# Patient Record
Sex: Male | Born: 1975 | Race: White | Hispanic: No | Marital: Married | Smoking: Never smoker
Health system: Southern US, Community
[De-identification: ages and names within clinical notes are randomized; demographics above are authoritative.]

## PROBLEM LIST (undated history)

## (undated) DIAGNOSIS — E119 Type 2 diabetes mellitus without complications: Secondary | ICD-10-CM

## (undated) HISTORY — DX: Type 2 diabetes mellitus without complications: E11.9

## (undated) HISTORY — PX: VASECTOMY: SHX75

## (undated) HISTORY — PX: MASTOIDECTOMY: SHX711

---

## 2007-12-25 ENCOUNTER — Ambulatory Visit: Payer: Self-pay | Admitting: Family Medicine

## 2007-12-25 DIAGNOSIS — E669 Obesity, unspecified: Secondary | ICD-10-CM | POA: Insufficient documentation

## 2008-01-04 ENCOUNTER — Ambulatory Visit: Payer: Self-pay | Admitting: Family Medicine

## 2008-01-05 LAB — CONVERTED CEMR LAB
LDL Cholesterol: 89 mg/dL (ref 0–99)
Total CHOL/HDL Ratio: 4.6
Triglycerides: 90 mg/dL (ref 0–149)

## 2013-12-25 ENCOUNTER — Ambulatory Visit (INDEPENDENT_AMBULATORY_CARE_PROVIDER_SITE_OTHER): Payer: No Typology Code available for payment source | Admitting: Family Medicine

## 2013-12-25 VITALS — BP 110/82 | HR 103 | Temp 99.0°F | Resp 16 | Ht 67.5 in | Wt 256.5 lb

## 2013-12-25 DIAGNOSIS — H9201 Otalgia, right ear: Secondary | ICD-10-CM

## 2013-12-25 DIAGNOSIS — H6091 Unspecified otitis externa, right ear: Secondary | ICD-10-CM

## 2013-12-25 DIAGNOSIS — H9209 Otalgia, unspecified ear: Secondary | ICD-10-CM

## 2013-12-25 DIAGNOSIS — H60399 Other infective otitis externa, unspecified ear: Secondary | ICD-10-CM

## 2013-12-25 MED ORDER — OFLOXACIN 0.3 % OT SOLN: 5.0000 [drp] | Freq: Two times a day (BID) | OTIC | Status: DC

## 2013-12-25 MED ORDER — AMOXICILLIN-POT CLAVULANATE 875-125 MG PO TABS: 1.0000 | ORAL_TABLET | Freq: Two times a day (BID) | ORAL | Status: DC

## 2013-12-25 MED ORDER — HYDROCODONE-ACETAMINOPHEN 5-325 MG PO TABS: 1.0000 | ORAL_TABLET | ORAL | Status: DC | PRN

## 2013-12-25 NOTE — Patient Instructions (Signed)
Augmentin 875 one twice daily. May cause slightly loose stools.  Use the ear drops, Floxin, 5 drops in the ear twice daily as directed  Take the hydrocodone pain pills one every 4 hours if needed for pain. It can cause a little sedation, so best used at nighttime.  In the daytime take ibuprofen 800 mg maximum 3 times in 24 hours (4x200 mg)  Return as needed

## 2013-12-25 NOTE — Progress Notes (Signed)
Subjective: 38 year old man who has a history of periodically getting ear infections. He had a mastoidectomy many years ago on the right side. He had a little cold a week or so ago. He was at the beach 2 weeks ago. He's been having ear pain for 4 days in the right ear. He may have had a little fever yesterday.  Objective: Left TM has a lot of scarring but no active inflammation. Right ear has some erythema and puffiness outside of the canal on the ear lobe, and the canal has significant swelling with a passageway of about 2 mm left. A tiny spot of the drum can be seen. Could not tell if there is any middle ear fluid or infection.  Assessment: Right otitis externa and otalgia  Plan: Floxin otic drops twice daily Augmentin 875 twice daily Norco when necessary pain Return if further problems. If he gets worse he would probably need a wick inserted.

## 2014-08-03 ENCOUNTER — Encounter (HOSPITAL_COMMUNITY): Payer: Self-pay | Admitting: Emergency Medicine

## 2014-08-03 ENCOUNTER — Emergency Department (HOSPITAL_COMMUNITY)
Admission: EM | Admit: 2014-08-03 | Discharge: 2014-08-04 | Disposition: A | Discharge status: Home / Self Care | Payer: Commercial Managed Care - PPO | Attending: Emergency Medicine | Admitting: Emergency Medicine

## 2014-08-03 ENCOUNTER — Emergency Department (HOSPITAL_COMMUNITY): Payer: Commercial Managed Care - PPO

## 2014-08-03 DIAGNOSIS — R002 Palpitations: Secondary | ICD-10-CM | POA: Insufficient documentation

## 2014-08-03 DIAGNOSIS — R079 Chest pain, unspecified: Secondary | ICD-10-CM | POA: Diagnosis present

## 2014-08-03 LAB — BASIC METABOLIC PANEL
Anion gap: 10 (ref 5–15)
BUN: 17 mg/dL (ref 6–23)
CO2: 24 mmol/L (ref 19–32)
Calcium: 9.3 mg/dL (ref 8.4–10.5)
Chloride: 103 mmol/L (ref 96–112)
Creatinine, Ser: 0.96 mg/dL (ref 0.50–1.35)
GFR calc Af Amer: >90 mL/min (ref >90)
GLUCOSE: 187 mg/dL — AB (ref 70–99)
POTASSIUM: 4.2 mmol/L (ref 3.5–5.1)
Sodium: 137 mmol/L (ref 135–145)

## 2014-08-03 LAB — BRAIN NATRIURETIC PEPTIDE: B NATRIURETIC PEPTIDE 5: 6.6 pg/mL (ref 0.0–100.0)

## 2014-08-03 LAB — I-STAT TROPONIN, ED: TROPONIN I, POC: 0 ng/mL (ref 0.00–0.08)

## 2014-08-03 LAB — CBC
HEMATOCRIT: 42.6 % (ref 39.0–52.0)
Hemoglobin: 14.5 g/dL (ref 13.0–17.0)
MCH: 28.3 pg (ref 26.0–34.0)
MCHC: 34 g/dL (ref 30.0–36.0)
MCV: 83 fL (ref 78.0–100.0)
PLATELETS: 240 10*3/uL (ref 150–400)
RBC: 5.13 MIL/uL (ref 4.22–5.81)
RDW: 14.3 % (ref 11.5–15.5)
WBC: 11.8 10*3/uL — AB (ref 4.0–10.5)

## 2014-08-03 NOTE — ED Provider Notes (Signed)
CSN: [id number]     Arrival date & time 08/03/14  1854 History   First MD Initiated Contact with Patient 08/03/14 2300     Chief Complaint  Patient presents with  . Chest Pain     (Consider location/radiation/quality/duration/timing/severity/associated sxs/prior Treatment) HPI   39 year old male presents for evaluation of chest pain. Patient reports the past 3 days he has had intermittent heart palpitation which he described as a fluttering heart and occasional chest tightness. Symptoms waxing and waning but today worsen. Earlier today he developed and/or follows with a migraine headache lasting for a few hours but has since resolved. Did report mild nausea but that has resolved as well. His chest discomfort is now 2 of 10. He denies having any fever, chills, lightheadedness, dizziness, pleuritic chest pain, productive cough, hemoptysis, abdominal pain, vomiting or diarrhea. Denies any recent medication changes. Does report family history of cardiac disease including atrial fibrillation in both grandfather and father which concerns him. He denies having prior history of PE/ DVT, no recent surgery, prolonged bed rest, unilateral leg swelling or calf pain. He is a nonsmoker. Denies taking any stimulants such as caffeine, although the congestion. He did report having poison ivy several weeks ago and was taken and homeopathic medication for 2 weeks. He does not know the name of the medication.      History reviewed. No pertinent past medical history. Past Surgical History  Procedure Laterality Date  . Mastoidectomy     No family history on file. History  Substance Use Topics  . Smoking status: Never Smoker   . Smokeless tobacco: Never Used  . Alcohol Use: No    Review of Systems  All other systems reviewed and are negative.     Allergies  Review of patient's allergies indicates no known allergies.  Home Medications   Prior to Admission medications   Medication Sig Start Date  End Date Taking? Authorizing Provider  amoxicillin-clavulanate (AUGMENTIN) 875-125 MG per tablet Take 1 tablet by mouth 2 (two) times daily. Patient not taking: Reported on 08/03/2014 12/25/13   Peyton Najjar, MD  HYDROcodone-acetaminophen [facility]) 5-325 MG per tablet Take 1 tablet by mouth every 4 (four) hours as needed. Patient not taking: Reported on 08/03/2014 12/25/13   Peyton Najjar, MD  ofloxacin (FLOXIN) 0.3 % otic solution Place 5 drops into the right ear 2 (two) times daily. Patient not taking: Reported on 08/03/2014 12/25/13   Peyton Najjar, MD   BP 126/78 mmHg  Pulse 66  Temp(Src) 97.9 F (36.6 C)  Resp 18  Ht  (1.727 m)  Wt 265 lb (120.203 kg)  BMI 40.30 kg/m2  SpO2 96% Physical Exam  Constitutional: He is oriented to person, place, and time. He appears well-developed and well-nourished. No distress.  HENT:  Head: Atraumatic.  Eyes: Conjunctivae are normal.  Neck: Normal range of motion. Neck supple. No JVD present.  Cardiovascular: Normal rate, regular rhythm and intact distal pulses.   Pulmonary/Chest: Effort normal and breath sounds normal. No respiratory distress. He exhibits no tenderness.  Abdominal: Soft. Bowel sounds are normal. There is no tenderness.  Musculoskeletal: He exhibits no edema.  Neurological: He is alert and oriented to person, place, and time.  Skin: No rash noted.  Psychiatric: He has a normal mood and affect.  Nursing note and vitals reviewed.   ED Course  Procedures (including critical care time)  Patient here with complaint of heart palpitation and chest discomfort. Palpitation has resolved. EKG shows normal sinus  rhythm without any arrhythmia or acute ischemic changes. Symptoms atypical for ACS. Heart score of 1, low risk for MACE.  Pt is PERC negative.  Pt stable for discharge with outpt f/u .   Labs Review Labs Reviewed  CBC - Abnormal; Notable for the following:    WBC 11.8 (*)    All other components within normal limits  BASIC  METABOLIC PANEL - Abnormal; Notable for the following:    Glucose, Bld 187 (*)    All other components within normal limits  BRAIN NATRIURETIC PEPTIDE  I-STAT TROPOININ, ED    Imaging Review Dg Chest 2 View  08/03/2014   CLINICAL DATA:  [age] year old male with chest pressure, nausea and palpitations  EXAM: CHEST  2 VIEW  COMPARISON:  None.  FINDINGS: Cardiopericardial silhouette is top-normal in size. No evidence of pulmonary edema, focal airspace consolidation, pleural effusion or pneumothorax. No acute osseous abnormality. Unremarkable visualized upper abdominal bowel gas pattern.  IMPRESSION: 1. No active cardiopulmonary process. 2. Heart size is at the upper limits of normal.   Electronically Signed   By: Malachy MoanHeath  McCullough M.D.   On: 08/03/2014 19:58     EKG Interpretation None      Date: 08/03/2014  Rate:76  Rhythm: normal sinus rhythm  QRS Axis: normal  Intervals: normal  ST/T Wave abnormalities: normal  Conduction Disutrbances: none  Narrative Interpretation:   Old EKG Reviewed: No significant changes noted     MDM   Final diagnoses:  Heart palpitations    BP 126/78 mmHg  Pulse 66  Temp(Src) 97.9 F (36.6 C)  Resp 18  Ht 5\' 8"  (1.727 m)  Wt 265 lb (120.203 kg)  BMI 40.30 kg/m2  SpO2 96%  I have reviewed nursing notes and vital signs. I personally reviewed the imaging tests through PACS system  I reviewed available ER/hospitalization records thought the EMR     Fayrene HelperBowie Keelynn Furgerson, PA-C 08/04/14 0010  Benjiman CoreNathan Pickering, MD 08/06/14 [phone]

## 2014-08-03 NOTE — ED Notes (Signed)
C/o fluttering feeling in chest and tightness across chest that started last night while watching tv.  States pain has been consistent today with sob and nausea.  Denies vomiting.  Also reports headache.

## 2014-08-04 NOTE — Discharge Instructions (Signed)
Please follow up with a primary care provider for further evaluation of your heart palpitation.  Return if your condition worsen or if you have other concerns.  Use resources below to find a primary care provider.   Palpitations A palpitation is the feeling that your heartbeat is irregular or is faster than normal. It may feel like your heart is fluttering or skipping a beat. Palpitations are usually not a serious problem. However, in some cases, you may need further medical evaluation. CAUSES  Palpitations can be caused by:  Smoking.  Caffeine or other stimulants, such as diet pills or energy drinks.  Alcohol.  Stress and anxiety.  Strenuous physical activity.  Fatigue.  Certain medicines.  Heart disease, especially if you have a history of irregular heart rhythms (arrhythmias), such as atrial fibrillation, atrial flutter, or supraventricular tachycardia.  An improperly working pacemaker or defibrillator. DIAGNOSIS  To find the cause of your palpitations, your health care provider will take your medical history and perform a physical exam. Your health care provider may also have you take a test called an ambulatory electrocardiogram (ECG). An ECG records your heartbeat patterns over a 24-hour period. You may also have other tests, such as:  Transthoracic echocardiogram (TTE). During echocardiography, sound waves are used to evaluate how blood flows through your heart.  Transesophageal echocardiogram (TEE).  Cardiac monitoring. This allows your health care provider to monitor your heart rate and rhythm in real time.  Holter monitor. This is a portable device that records your heartbeat and can help diagnose heart arrhythmias. It allows your health care provider to track your heart activity for several days, if needed.  Stress tests by exercise or by giving medicine that makes the heart beat faster. TREATMENT  Treatment of palpitations depends on the cause of your symptoms and can  vary greatly. Most cases of palpitations do not require any treatment other than time, relaxation, and monitoring your symptoms. Other causes, such as atrial fibrillation, atrial flutter, or supraventricular tachycardia, usually require further treatment. HOME CARE INSTRUCTIONS   Avoid:  Caffeinated coffee, tea, soft drinks, diet pills, and energy drinks.  Chocolate.  Alcohol.  Stop smoking if you smoke.  Reduce your stress and anxiety. Things that can help you relax include:  A method of controlling things in your body, such as your heartbeats, with your mind (biofeedback).  Yoga.  Meditation.  Physical activity such as swimming, jogging, or walking.  Get plenty of rest and sleep. SEEK MEDICAL CARE IF:   You continue to have a fast or irregular heartbeat beyond 24 hours.  Your palpitations occur more often. SEEK IMMEDIATE MEDICAL CARE IF:  You have chest pain or shortness of breath.  You have a severe headache.  You feel dizzy or you faint. MAKE SURE YOU:  Understand these instructions.  Will watch your condition.  Will get help right away if you are not doing well or get worse. Document Released: 03/22/2000 Document Revised: 03/30/2013 Document Reviewed: 05/24/2011 [facility] Patient Information 2015 [employer], [place]. This information is not intended to replace advice given to you by your health care provider. Make sure you discuss any questions you have with your health care provider.   Emergency Department Resource Guide 1) Find a Doctor and Pay Out of Pocket Although you won't have to find out who is covered by your insurance plan, it is a good idea to ask around and get recommendations. You will then need to call the office and see if the doctor you have  chosen will accept you as a new patient and what types of options they offer for patients who are self-pay. Some doctors offer discounts or will set up payment plans for their patients who do not have insurance,  but you will need to ask so you aren't surprised when you get to your appointment.  2) Contact Your Local Health Department Not all health departments have doctors that can see patients for sick visits, but many do, so it is worth a call to see if yours does. If you don't know where your local health department is, you can check in your phone book. The CDC also has a tool to help you locate your state's health department, and many state websites also have listings of all of their local health departments.  3) Find a Walk-in Clinic If your illness is not likely to be very severe or complicated, you may want to try a walk in clinic. These are popping up all over the country in pharmacies, drugstores, and shopping centers. They're usually staffed by nurse practitioners or physician assistants that have been trained to treat common illnesses and complaints. They're usually fairly quick and inexpensive. However, if you have serious medical issues or chronic medical problems, these are probably not your best option.  No Primary Care Doctor: - Call Health Connect at  [phone] - they can help you locate a primary care doctor that  accepts your insurance, provides certain services, etc. - Physician Referral Service- [phone]  Chronic Pain Problems: Organization         Address  Phone   Notes  [facility]  [phone] Patients need to be referred by their primary care doctor.   Medication Assistance: Organization         Address  Phone   Notes  [facility] Medication [facility] [address] [place] [place], [place] [address] [phone] --Must be a resident of [facility] -- Must have NO insurance coverage whatsoever (no Medicaid/ Medicare, etc.) -- The pt. MUST have a primary care doctor that directs their care regularly and follows them in the community   [facility]  [phone]   [employer]  [phone]    Agencies that provide inexpensive  medical care: Organization         Address  Phone   Notes  [facility]  [phone]   [facility]    [phone]   [facility] [address] [place], [place] [address] [phone]   [facility] [address] [place] [place] [phone]   [facility]anned Parenthood    [phone]   [facility]    [phone]   [facility]  [address], [place] Phone:  [phone], Fax:  [phone] Hours of Operation:  9 am - 6 pm, M-F.  Also accepts Medicaid/Medicare and self-pay.  [facility]  [address], [place] Phone: [phone], Fax: [phone]. Hours of Operation:  8:30 am - 5:30 pm, M-F.  Also accepts Medicaid and self-pay.  [facility] [place] [address] [place] Phone: [phone]   [facility] [address] Natasha Bence [place], [place] [phone], Ext. [phone] Mondays & Thursdays: 7-9 AM.  First 15 patients are seen on a first come, first serve basis.    Medicaid-accepting [facility] Providers:  Organization  Address  Phone   Notes  [facility] [address], [place] [phone] Also accepts self-pay patients.  [facility] [address] Laurell Josephs [place], [place]  [phone]   [facility] [address], [place] [phone]   [facility] [address], [place] [phone]   Renaye Rakers [address], [place]   [phone] Only accepts [place] Access [place] patients after they have their name applied to their card.   Self-Pay (no insurance) in [facility]:  Organization         Address  Phone   Notes  Sickle Cell Patients, [facility] [address] [place], [place] [phone]   [facility] [address] [place], [place] [phone]   [facility] [place]  [address], [place] [phone]   [facility]/Dr. Osei-Bonsu  [address] [place] or [address], [place] [phone] Phone number for both [place] and [place] locations is the same.  [facility] [address] [place] [phone]   [facility] [address], [place] or [address] [address] [phone]   [facility] [address] [place] [phone], phone; [phone], fax Sees patients 1st and 3rd Saturday of every month.  Must not qualify for public or private insurance (i.e. Medicaid, Medicare, [place] Health Choice, Veterans' Benefits)  Household income should be no more than 200% of the poverty level The clinic cannot treat you if you are pregnant or think you are pregnant  Sexually transmitted diseases are not treated at the clinic.    Dental Care: Organization         Address  Phone  Notes  [facility] Department of [facility] [address] [place], [place] [phone] Accepts children up to age 39 who are enrolled in [place] or [place] Health Choice; pregnant women with a Medicaid card; and children who have applied for Medicaid or [place] Health Choice, but were declined, whose parents can pay a reduced fee at time of service.  [facility] Department of [facility]  [address], [place] [phone] Accepts children up to age 6 who are enrolled in [place] or [place] Health Choice; pregnant women with a Medicaid card; and children who have applied for Medicaid or [place] Health Choice, but were declined, whose parents can pay a reduced fee at time of service.  [facility]  [address] [place], [place] [phone] Patients are seen by appointment only. Walk-ins are not accepted.  [facility] will see patients 52 years of age and older. Monday - Tuesday (8am-5pm) Most Wednesdays (8:30-5pm) $30 per visit, cash only  [facility] Adult Dental Access PRO[facility]  [address], [facility] [phone] Patients are seen by appointment only. Walk-ins are not accepted. [facility] will see patients 103 years of age and older. One Wednesday Evening (Monthly: Volunteer Based).  $30 per visit, cash only  [employer]  [phone] for adults; Children under age 12, call Graduate Pediatric Dentistry at [phone]. Children aged 2-14, please call [phone] to request a pediatric application.  Dental services are provided in all areas of dental care including  fillings, crowns and bridges, complete and partial dentures, implants, gum treatment, root canals, and extractions. Preventive care is also provided. Treatment is provided to both adults and children. Patients are selected via a lottery and there is often a waiting list.   [facility] [address], [place]  [phone] [email or web address]   [facility] [address] [place], [place] [phone], Ext. [phone] Second and Fourth Thursday of each month, opens at 6:30 AM; Clinic ends at 9 AM.  Patients are seen on a first-come first-served basis, and a limited number are seen during each clinic.   [facility]  [address] Ether Griffins [place], [place] [phone]   Eligibility Requirements You must have lived in [place], [place], or [place] counties for at least the last three months.   You cannot be eligible for state or federal sponsored [place], including [employer], [place], or [employer].   You generally cannot be eligible for healthcare insurance through your employer.    How to apply: Eligibility screenings are held every Tuesday and Wednesday afternoon from 1:00 pm until 4:00 pm. You do not need an appointment for the  interview!  [facility] [address], [place], [place] [phone]   [facility] Health [facility]  [phone]   [facility] Health Department  [phone]   [facility] Health Department  [phone]    Behavioral Health Resources in the Community: Intensive Outpatient Programs Organization         Address  Phone  Notes  [facility] [address] [place], [place] [phone]   [facility] Outpatient [address], [place], [place] [phone]   [facility] [address] [place], [place]  [phone]   [facility] [address]  [place], [place] [phone] or [phone]   Substance Abuse Resources Organization         Address  Phone  Notes  [facility]  [phone]   [facility]  [phone]   [facility]  [phone]   Floydene Flock  [phone]   Residential & Outpatient Substance Abuse Program  [phone]   Psychological Services Organization         Address  Phone  Notes  [facility] Health  [phone]   [facility]  [phone]   [facility] Health [address], [place] [phone] or [phone]    Mobile Crisis Teams Organization         Address  Phone  Notes  [facility], Mobile Crisis Care Unit  [phone]   [facility]  [address] [place], [place] [phone]   Doristine Locks [address] [place] [phone]    Self-Help/Support Groups Organization         Address  Phone             Notes  [facility] [place] - variety of support groups  336- I7437963 Call for more information  Narcotics Anonymous (NA), [facility] [address], [employer] [place]  2 meetings at this location   Statistician         Address  Phone  Notes  [facility]  [address],    [place]  [phone]   [facility]  [address], [place] [address], [place], [place] [phone]   [facility] [address] [place],  [place] [phone] Admissions: 8am-3pm M-F  °[facility] [address]    °[place], [place] [phone]   °[facility] [address], [place], [place] [phone]   °[facility] [address]  °[place], [place] [phone]   °[facility] - Intensive Outpatient [address], [place], [place] [phone]   °[facility] [address]  °[place], [place] 1-877-615-2722 or [phone]   °[facility] ([facility]) [address] [place], [place] [phone] Accepts Medicaid  °[facility] [address]  °[place] 1-800-659-3381 Substance Abuse/Addiction Treatment  ° °[place] Behavioral Health Resources °Organization         Address  Phone  Notes  °[facility]  (888) [phone]   °Julie Brannon, PhD [address] [place], [place]   (336) 349-5553 or (336) 951-0000   °[facility]   [address] °[place], [place] (336) 349-4454   °[facility] [address], [place], [place] ([phone]) [phone] Insurance/Medicaid/sponsorship through [facility]  °[facility] [address]                                    [place], [place] ([phone]) [phone] Therapy/tele-psych/case  °[facility] [address]  ° [place], [place] ([phone]) [phone]    °Dr. Arfeen  ([phone]) [phone]   °[facility] [place]  [employer] [place] [facility] 1) [address], [place] °2) [address], [place] °3)  [address], [place] ([phone]) [phone] °(336) [phone] ° °(336) [phone]   °[place] Child Abuse Hotline (336) 342-1394 or (336) 342-3537 (After Hours)    ° ° ° °

## 2014-10-04 ENCOUNTER — Ambulatory Visit (INDEPENDENT_AMBULATORY_CARE_PROVIDER_SITE_OTHER): Payer: Commercial Managed Care - PPO | Admitting: Cardiovascular Disease

## 2014-10-04 ENCOUNTER — Encounter: Payer: Self-pay | Admitting: Cardiovascular Disease

## 2014-10-04 ENCOUNTER — Encounter (INDEPENDENT_AMBULATORY_CARE_PROVIDER_SITE_OTHER): Payer: Self-pay

## 2014-10-04 VITALS — BP 110/70 | HR 70 | Ht 68.0 in | Wt 253.2 lb

## 2014-10-04 DIAGNOSIS — E119 Type 2 diabetes mellitus without complications: Secondary | ICD-10-CM | POA: Diagnosis not present

## 2014-10-04 DIAGNOSIS — E669 Obesity, unspecified: Secondary | ICD-10-CM

## 2014-10-04 DIAGNOSIS — R002 Palpitations: Secondary | ICD-10-CM | POA: Diagnosis not present

## 2014-10-04 NOTE — Patient Instructions (Signed)
You are doing well. No medication changes were made.  Please call us if you have new issues that need to be addressed before your next appt.    

## 2014-10-04 NOTE — Progress Notes (Signed)
Patient ID: Gary Bailey, male    DOB: 07/07/1975, 39 y.o.   MRN: [id number]  HPI Comments: [name] is a pleasant 39 year old gentleman with history of obesity, type 2 diabetes, recently started on metformin with history of palpitations, recently seen in the emergency room for ectopy, chest discomfort, who presents to establish care in the [place].  He was told in the emergency room that he had a mildly enlarged heart. Chest x-ray shows heart size at upper limits of normal.  He does report a history of palpitations that seems to come and go. Palpitations are felt in the daytime and nighttime. He did appreciate ectopy on his monitor in the emergency room. He was told by emergency room physicians this was a benign finding. No further workup was offered He otherwise feels well. He has been losing weight over the past several months given recent diagnosis of diabetes. Hemoglobin A1c 7.6, fasting glucose was elevated. He reports weight is down 20 pounds. He started low-dose aspirin on his own for preventive measures  Denies having any chest pain or shortness of breath with heavy exertion. He works with heavy machinery.  EKG on today's visit shows normal sinus rhythm with rate 70 bpm, no significant ST or T-wave changes   No Known Allergies  No current outpatient prescriptions on file prior to visit.   No current facility-administered medications on file prior to visit.    Past Medical History  Diagnosis Date  . Diabetes mellitus without complication     Past Surgical History  Procedure Laterality Date  . Mastoidectomy    . Vasectomy      Social History  reports that he has never smoked. He has never used smokeless tobacco. He reports that he drinks alcohol. He reports that he does not use illicit drugs.  Family History family history includes Arrhythmia in his father.   Review of Systems  Constitutional: Negative.   Respiratory: Negative.    Cardiovascular: Negative.   Gastrointestinal: Negative.   Musculoskeletal: Negative.   Skin: Negative.   Neurological: Negative.   Hematological: Negative.   Psychiatric/Behavioral: Negative.   All other systems reviewed and are negative.   BP 110/70 mmHg  Pulse 70  Ht 5\' 8"  (1.727 m)  Wt 253 lb 4 oz (114.873 kg)  BMI 38.52 kg/m2  Physical Exam  Constitutional: He is oriented to person, place, and time. He appears well-developed and well-nourished.  HENT:  Head: Normocephalic.  Nose: Nose normal.  Mouth/Throat: Oropharynx is clear and moist.  Eyes: Conjunctivae are normal. Pupils are equal, round, and reactive to light.  Neck: Normal range of motion. Neck supple. No JVD present.  Cardiovascular: Normal rate, regular rhythm, S1 normal, S2 normal, normal heart sounds and intact distal pulses.  Exam reveals no gallop and no friction rub.   No murmur heard. Pulmonary/Chest: Effort normal and breath sounds normal. No respiratory distress. He has no wheezes. He has no rales. He exhibits no tenderness.  Abdominal: Soft. Bowel sounds are normal. He exhibits no distension. There is no tenderness.  Musculoskeletal: Normal range of motion. He exhibits no edema or tenderness.  Lymphadenopathy:    He has no cervical adenopathy.  Neurological: He is alert and oriented to person, place, and time. Coordination normal.  Skin: Skin is warm and dry. No rash noted. No erythema.  Psychiatric: He has a normal mood and affect. His behavior is normal. Judgment and thought content normal.      Assessment and Plan   Nursing  note and vitals reviewed.

## 2014-10-04 NOTE — Assessment & Plan Note (Signed)
Weight has been trending down. Encouraged him to continue his strict diet

## 2014-10-04 NOTE — Assessment & Plan Note (Signed)
Palpitations likely from APCs. He does not want any medication such as propranolol for symptoms If symptoms get worse, recommended he call our office. Holter monitor could be ordered. He could take propranolol as needed at low dose, 10 mg when necessary

## 2014-10-04 NOTE — Assessment & Plan Note (Signed)
Weight has been trending down, he is determined to lose enough weight to come off metformin

## 2016-11-21 IMAGING — DX DG CHEST 2V
2 series · 2 of 2 positions shown · non-contrast
Comparison: None.

CLINICAL DATA: 38-year-old male with chest pressure, nausea and
palpitations

EXAM:
CHEST  2 VIEW

[chest pa]
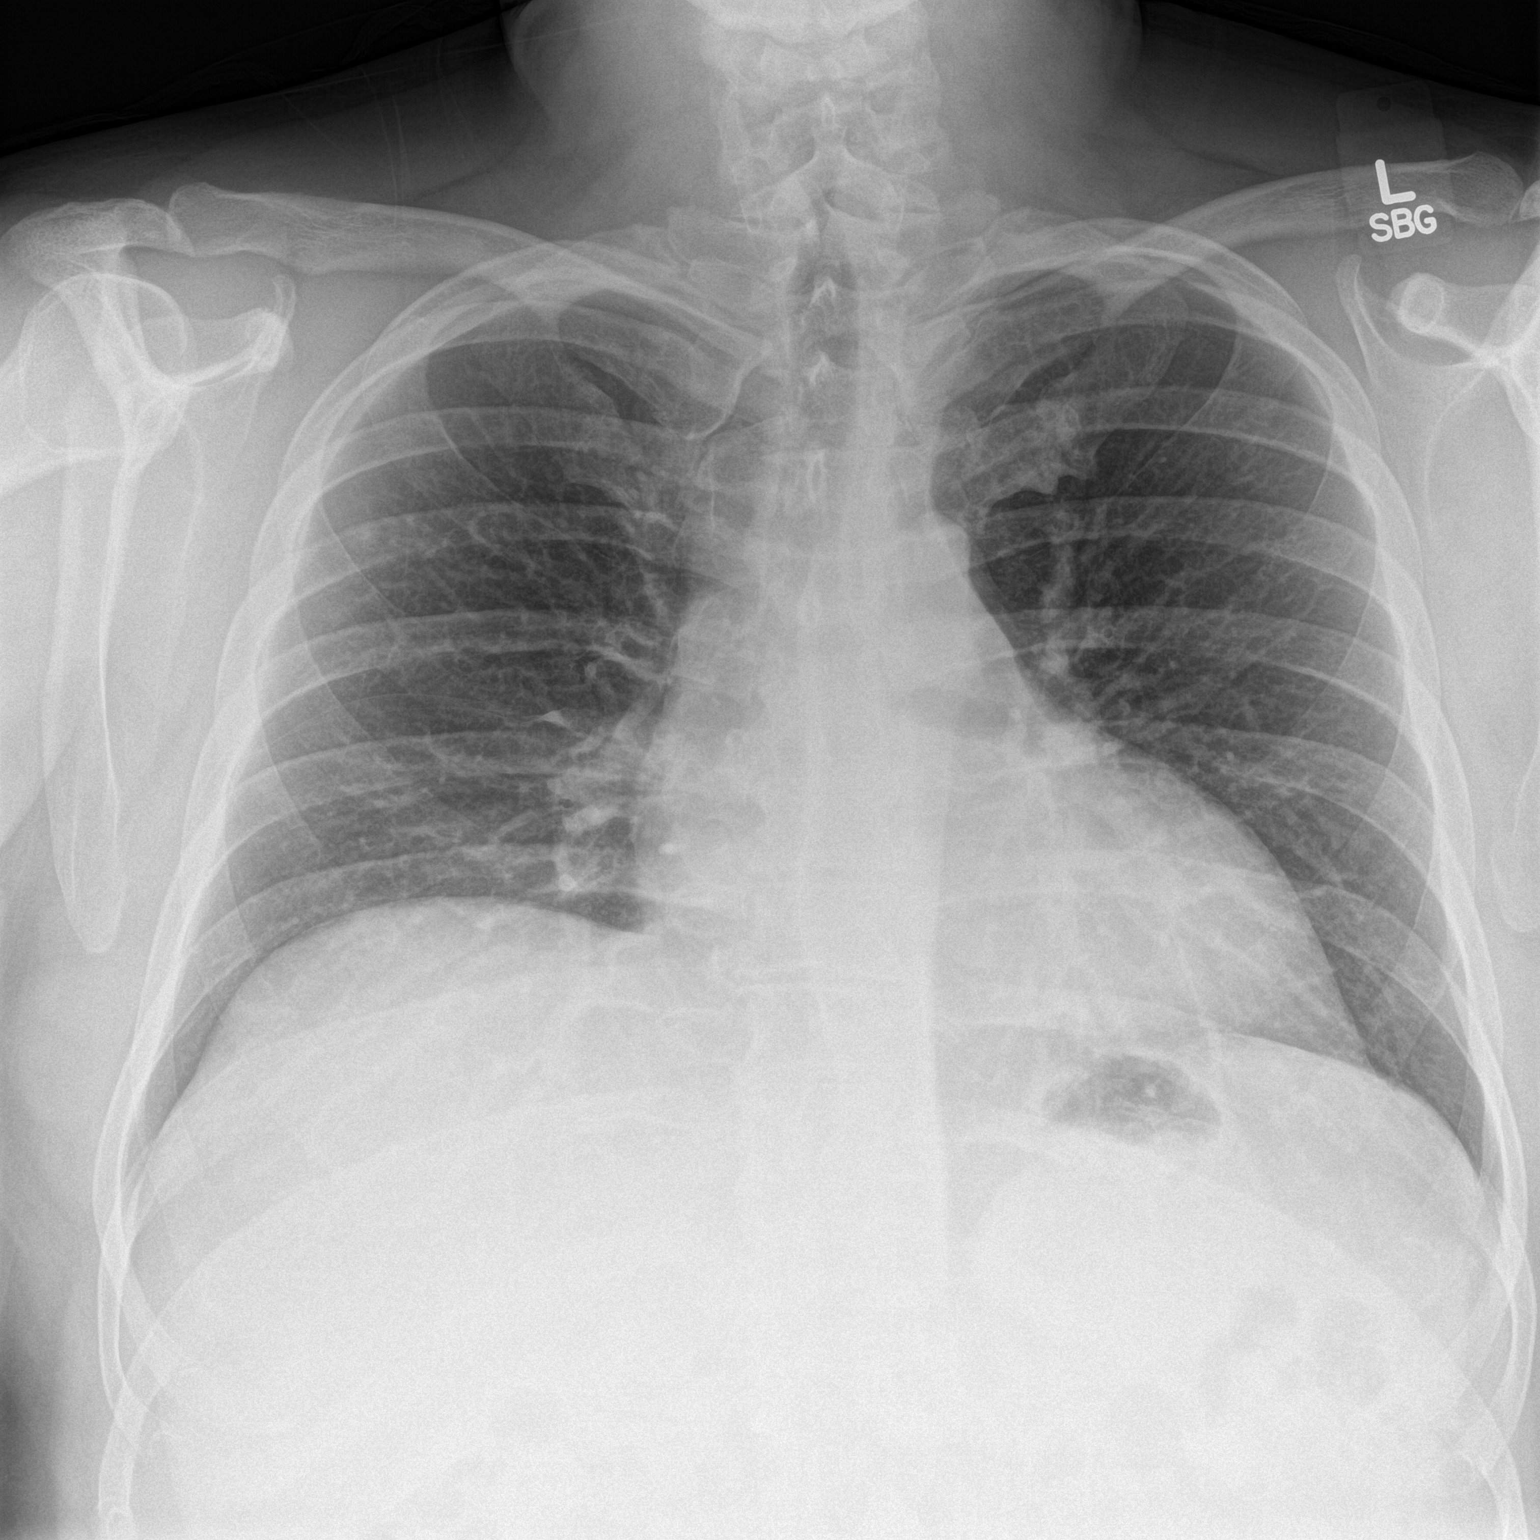

[chest lat]
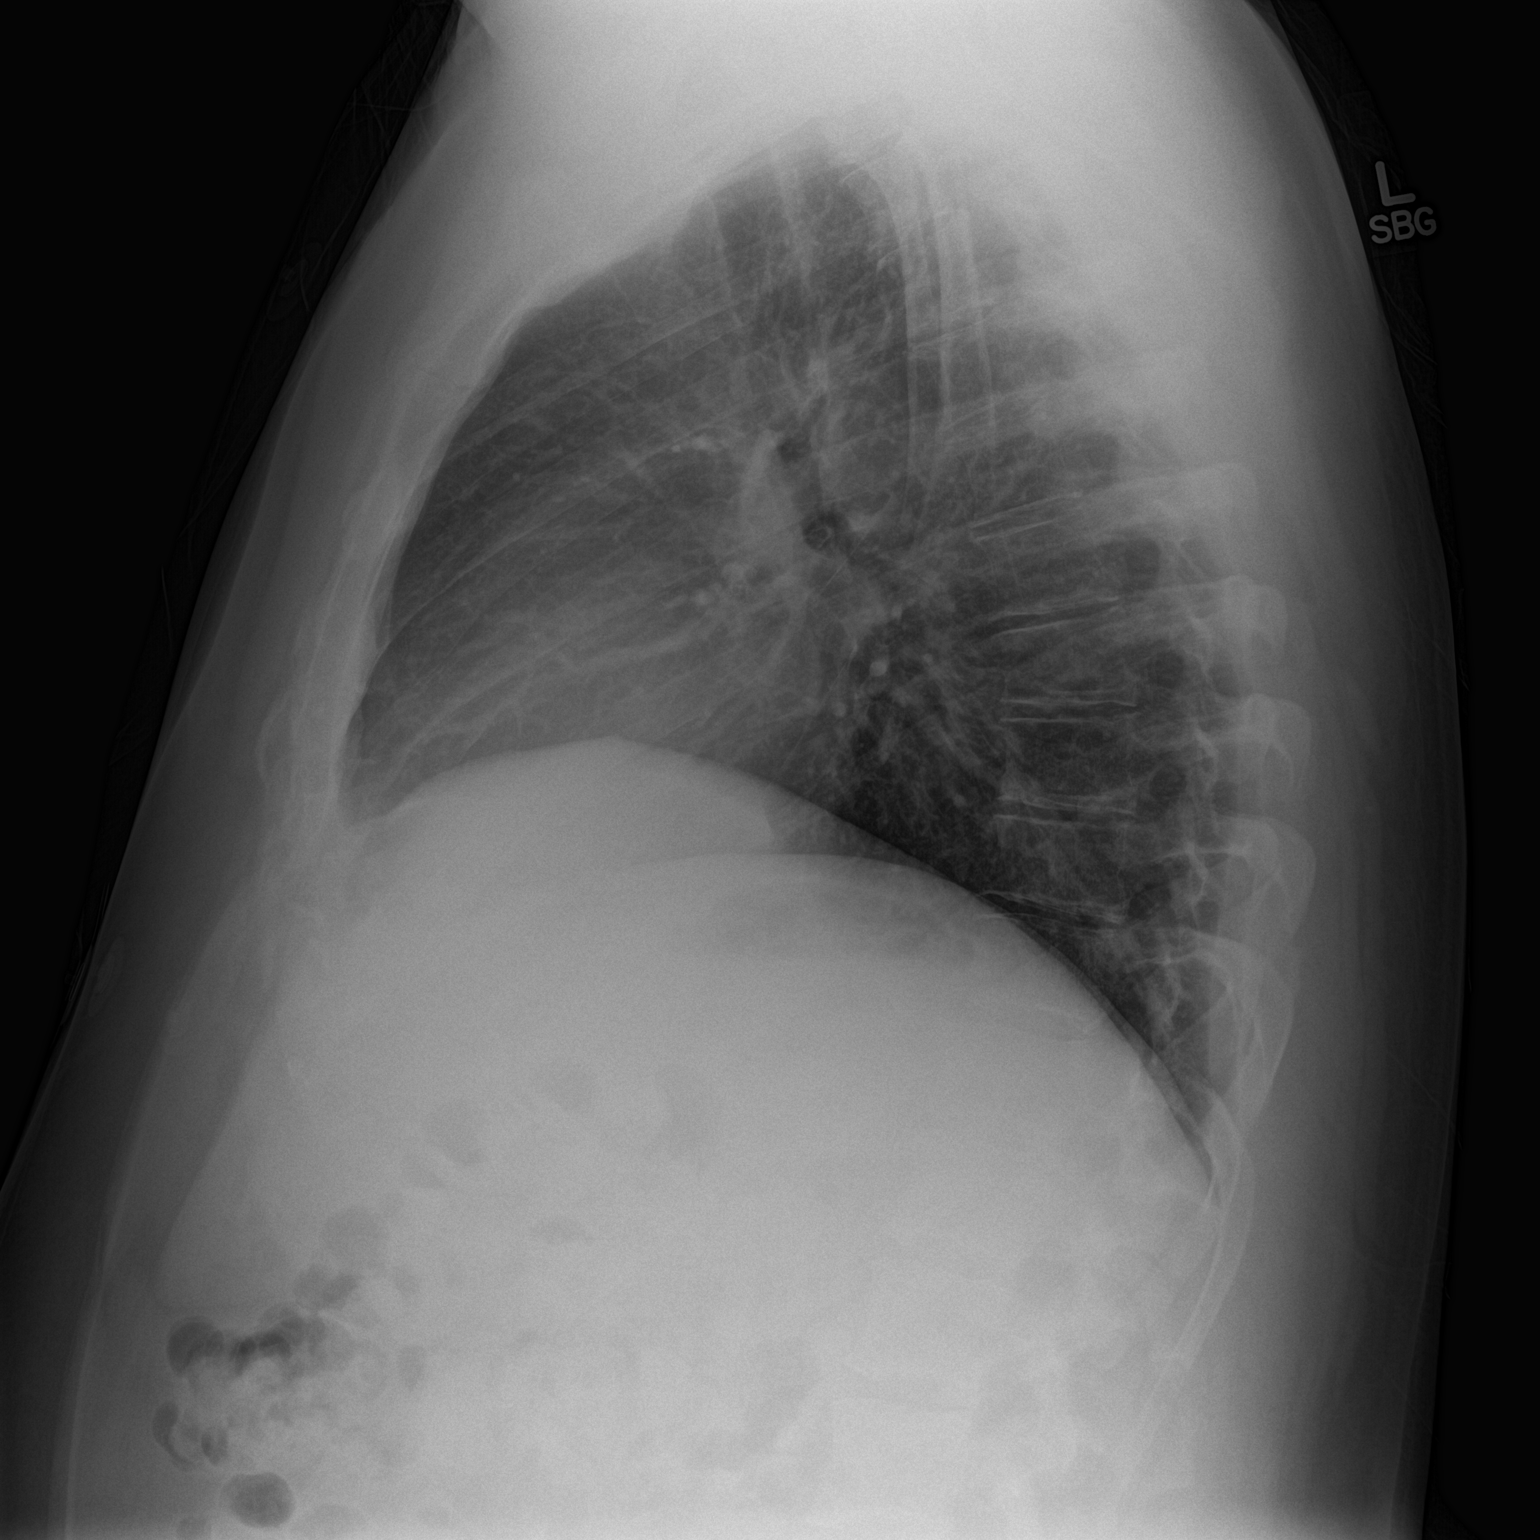

[2 of 2 positions shown; findings below may reference images not displayed]

FINDINGS: Cardiopericardial silhouette is top-normal in size. No evidence of
pulmonary edema, focal airspace consolidation, pleural effusion or
pneumothorax. No acute osseous abnormality. Unremarkable visualized
upper abdominal bowel gas pattern.
IMPRESSION: 1. No active cardiopulmonary process.
2. Heart size is at the upper limits of normal.
